# Patient Record
Sex: Male | Born: 1975 | Race: Black or African American | Hispanic: No | Marital: Single | Smoking: Current every day smoker
Health system: Southern US, Community
[De-identification: ages and names within clinical notes are randomized; demographics above are authoritative.]

---

## 1999-01-22 ENCOUNTER — Emergency Department (HOSPITAL_COMMUNITY): Admission: EM | Admit: 1999-01-22 | Discharge: 1999-01-22 | Payer: Self-pay | Admitting: Emergency Medicine

## 2000-04-29 ENCOUNTER — Emergency Department (HOSPITAL_COMMUNITY): Admission: EM | Admit: 2000-04-29 | Discharge: 2000-04-29 | Payer: Self-pay | Admitting: Emergency Medicine

## 2000-06-02 ENCOUNTER — Emergency Department (HOSPITAL_COMMUNITY): Admission: EM | Admit: 2000-06-02 | Discharge: 2000-06-02 | Payer: Self-pay | Admitting: Emergency Medicine

## 2000-07-17 ENCOUNTER — Emergency Department (HOSPITAL_COMMUNITY): Admission: EM | Admit: 2000-07-17 | Discharge: 2000-07-17 | Payer: Self-pay | Admitting: Emergency Medicine

## 2002-07-23 ENCOUNTER — Inpatient Hospital Stay (HOSPITAL_COMMUNITY): Admission: EM | Admit: 2002-07-23 | Discharge: 2002-07-25 | Payer: Self-pay

## 2002-07-23 ENCOUNTER — Encounter: Payer: Self-pay | Admitting: Emergency Medicine

## 2002-07-25 ENCOUNTER — Encounter: Payer: Self-pay | Admitting: Neurosurgery

## 2003-04-17 ENCOUNTER — Emergency Department (HOSPITAL_COMMUNITY): Admission: EM | Admit: 2003-04-17 | Discharge: 2003-04-17 | Payer: Self-pay | Admitting: Emergency Medicine

## 2003-04-19 ENCOUNTER — Emergency Department (HOSPITAL_COMMUNITY): Admission: EM | Admit: 2003-04-19 | Discharge: 2003-04-19 | Payer: Self-pay | Admitting: Emergency Medicine

## 2006-07-07 ENCOUNTER — Emergency Department (HOSPITAL_COMMUNITY): Admission: EM | Admit: 2006-07-07 | Discharge: 2006-07-07 | Payer: Self-pay | Admitting: Emergency Medicine

## 2008-08-08 IMAGING — CR DG CHEST 1V
1 series · 1 of 1 positions shown · non-contrast
Comparison: NONE

CLINICAL DATA: Positive PPD. 

CHEST - SINGLE VIEW (AP)

[view not recorded]
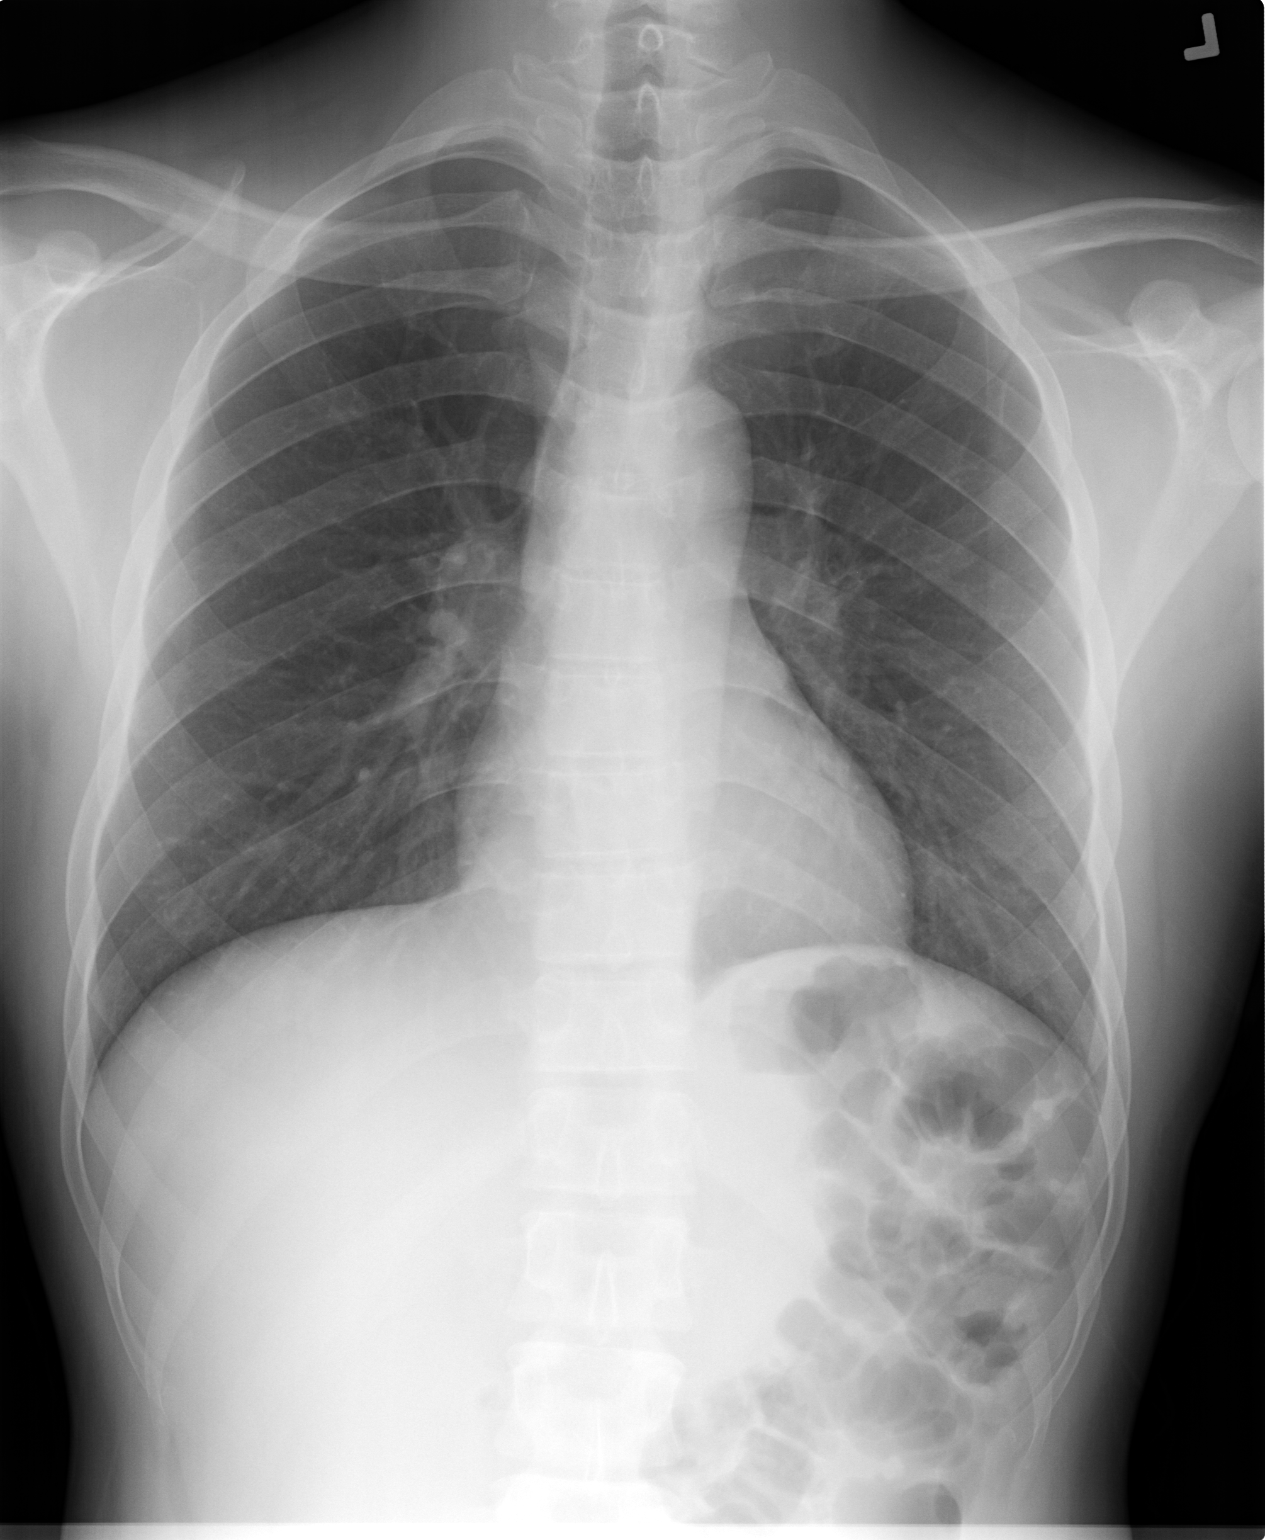

[1 of 1 positions shown; findings below may reference images not displayed]

FINDINGS: There are no prior images for comparison. The lungs are 
clear and well expanded.   Heart and pulmonary vessels are normal. 
 No significant abnormalities are noted in the regional skeleton.
IMPRESSION: No active disease. No active tuberculosis. Rigersi 
10/13/2006  Trans Date: 10/14/2006 [facility]  [REDACTED]

## 2009-08-16 ENCOUNTER — Emergency Department (HOSPITAL_COMMUNITY): Admission: EM | Admit: 2009-08-16 | Discharge: 2009-08-16 | Payer: Self-pay | Admitting: Emergency Medicine

## 2009-11-23 ENCOUNTER — Emergency Department (HOSPITAL_COMMUNITY): Admission: EM | Admit: 2009-11-23 | Discharge: 2009-11-23 | Payer: Self-pay | Admitting: Emergency Medicine

## 2010-05-30 LAB — URINALYSIS, ROUTINE W REFLEX MICROSCOPIC
Bilirubin Urine: NEGATIVE
Ketones, ur: NEGATIVE mg/dL
Nitrite: NEGATIVE
pH: 6 (ref 5.0–8.0)

## 2010-05-30 LAB — GC/CHLAMYDIA PROBE AMP, GENITAL: GC Probe Amp, Genital: NEGATIVE

## 2010-05-30 LAB — URINE MICROSCOPIC-ADD ON

## 2010-06-03 LAB — COMPREHENSIVE METABOLIC PANEL
ALT: 49 U/L (ref 0–53)
CO2: 30 mEq/L (ref 19–32)
Calcium: 9 mg/dL (ref 8.4–10.5)
Creatinine, Ser: 0.64 mg/dL (ref 0.4–1.5)
GFR calc Af Amer: >60 mL/min (ref >60)
GFR calc non Af Amer: >60 mL/min (ref >60)
Glucose, Bld: 92 mg/dL (ref 70–99)
Sodium: 137 mEq/L (ref 135–145)
Total Protein: 7.4 g/dL (ref 6.0–8.3)

## 2010-06-03 LAB — DIFFERENTIAL
Lymphocytes Relative: 30 % (ref 12–46)
Lymphs Abs: 1.6 10*3/uL (ref 0.7–4.0)
Monocytes Relative: 8 % (ref 3–12)
Neutrophils Relative %: 60 % (ref 43–77)

## 2010-06-03 LAB — CBC
Hemoglobin: 14.1 g/dL (ref 13.0–17.0)
MCHC: 34.2 g/dL (ref 30.0–36.0)
MCV: 97.8 fL (ref 78.0–100.0)
RBC: 4.23 MIL/uL (ref 4.22–5.81)
RDW: 13.4 % (ref 11.5–15.5)

## 2010-06-03 LAB — URINALYSIS, ROUTINE W REFLEX MICROSCOPIC
Bilirubin Urine: NEGATIVE
Nitrite: NEGATIVE
Specific Gravity, Urine: 1.009 (ref 1.005–1.030)
Urobilinogen, UA: 0.2 mg/dL (ref 0.0–1.0)
pH: 8 (ref 5.0–8.0)

## 2010-06-03 LAB — SEDIMENTATION RATE: Sed Rate: 1 mm/hr (ref 0–16)

## 2010-06-03 LAB — CK: Total CK: 88 U/L (ref 7–232)

## 2010-06-03 LAB — LIPASE, BLOOD: Lipase: 34 U/L (ref 11–59)

## 2013-01-14 ENCOUNTER — Other Ambulatory Visit: Payer: Self-pay | Admitting: Occupational Medicine

## 2013-01-14 ENCOUNTER — Ambulatory Visit: Payer: Self-pay

## 2013-01-14 DIAGNOSIS — R52 Pain, unspecified: Secondary | ICD-10-CM

## 2015-03-07 ENCOUNTER — Emergency Department (HOSPITAL_COMMUNITY)
Admission: EM | Admit: 2015-03-07 | Discharge: 2015-03-07 | Disposition: A | Discharge status: Home / Self Care | Payer: Self-pay | Attending: Emergency Medicine | Admitting: Emergency Medicine

## 2015-03-07 ENCOUNTER — Encounter (HOSPITAL_COMMUNITY): Payer: Self-pay

## 2015-03-07 DIAGNOSIS — F17200 Nicotine dependence, unspecified, uncomplicated: Secondary | ICD-10-CM | POA: Insufficient documentation

## 2015-03-07 DIAGNOSIS — F1010 Alcohol abuse, uncomplicated: Secondary | ICD-10-CM | POA: Insufficient documentation

## 2015-03-07 LAB — RAPID URINE DRUG SCREEN, HOSP PERFORMED
Amphetamines: NOT DETECTED
BARBITURATES: NOT DETECTED
Benzodiazepines: NOT DETECTED
COCAINE: NOT DETECTED
OPIATES: NOT DETECTED
Tetrahydrocannabinol: NOT DETECTED

## 2015-03-07 LAB — COMPREHENSIVE METABOLIC PANEL
ALK PHOS: 73 U/L (ref 38–126)
ALT: 54 U/L (ref 17–63)
ANION GAP: 9 (ref 5–15)
AST: 75 U/L — ABNORMAL HIGH (ref 15–41)
Albumin: 3.9 g/dL (ref 3.5–5.0)
BILIRUBIN TOTAL: 2.1 mg/dL — AB (ref 0.3–1.2)
BUN: 9 mg/dL (ref 6–20)
CALCIUM: 8.6 mg/dL — AB (ref 8.9–10.3)
CO2: 26 mmol/L (ref 22–32)
Chloride: 106 mmol/L (ref 101–111)
Creatinine, Ser: 0.68 mg/dL (ref 0.61–1.24)
GFR calc non Af Amer: >60 mL/min (ref >60)
Glucose, Bld: 99 mg/dL (ref 65–99)
Potassium: 4.2 mmol/L (ref 3.5–5.1)
SODIUM: 141 mmol/L (ref 135–145)
TOTAL PROTEIN: 7.3 g/dL (ref 6.5–8.1)

## 2015-03-07 LAB — LIPASE, BLOOD: Lipase: 71 U/L — ABNORMAL HIGH (ref 11–51)

## 2015-03-07 LAB — CBC
HCT: 41.6 % (ref 39.0–52.0)
HEMOGLOBIN: 13.8 g/dL (ref 13.0–17.0)
MCH: 32 pg (ref 26.0–34.0)
MCHC: 33.2 g/dL (ref 30.0–36.0)
MCV: 96.5 fL (ref 78.0–100.0)
Platelets: 330 10*3/uL (ref 150–400)
RBC: 4.31 MIL/uL (ref 4.22–5.81)
RDW: 13 % (ref 11.5–15.5)
WBC: 5.1 10*3/uL (ref 4.0–10.5)

## 2015-03-07 LAB — URINALYSIS, ROUTINE W REFLEX MICROSCOPIC
Bilirubin Urine: NEGATIVE
Glucose, UA: NEGATIVE mg/dL
Hgb urine dipstick: NEGATIVE
Ketones, ur: NEGATIVE mg/dL
Leukocytes, UA: NEGATIVE
NITRITE: NEGATIVE
Protein, ur: NEGATIVE mg/dL
SPECIFIC GRAVITY, URINE: 1.006 (ref 1.005–1.030)
pH: 7 (ref 5.0–8.0)

## 2015-03-07 LAB — SALICYLATE LEVEL: Salicylate Lvl: <4 mg/dL (ref 2.8–30.0)

## 2015-03-07 LAB — ACETAMINOPHEN LEVEL

## 2015-03-07 LAB — CK: CK TOTAL: 267 U/L (ref 49–397)

## 2015-03-07 LAB — ETHANOL: Alcohol, Ethyl (B): 363 mg/dL (ref <5)

## 2015-03-07 MED ORDER — LORAZEPAM 2 MG/ML IJ SOLN
2.0000 mg | Freq: Once | INTRAMUSCULAR | Status: DC
  Filled 2015-03-07: qty 1

## 2015-03-07 MED ORDER — SODIUM CHLORIDE 0.9 % IV BOLUS (SEPSIS)
1000.0000 mL | Freq: Once | INTRAVENOUS | Status: AC
  Administered 2015-03-07: 1000 mL via INTRAVENOUS

## 2015-03-07 MED ORDER — HALOPERIDOL LACTATE 5 MG/ML IJ SOLN
5.0000 mg | Freq: Once | INTRAMUSCULAR | Status: DC
  Filled 2015-03-07: qty 1

## 2015-03-07 NOTE — ED Notes (Signed)
Patient verbalized understanding of discharge instructions and denies further questions at this time. VS stable, patient ambulatory with steady gait.

## 2015-03-07 NOTE — ED Notes (Addendum)
Pt called out because he wanted the pulse ox taken off of his finger, stating that it was giving him a headache. This RN took the pulse ox off of his finger.

## 2015-03-07 NOTE — ED Notes (Signed)
Pt found wandering towards the exit. Pt still had his IV in. Pt states that his ride is here, and that they are waiting at the entrance.

## 2015-03-07 NOTE — ED Notes (Signed)
This RN walked into the pt's room to assess the pt, and found Sterling, EMT attempting to have the pt use a urinal. Pt was tearful, and started shouting and chanting loudly. Wickline, MD arrived at pt's room, and pt would not stop crying, shouting and chanting to answer the doctor's questions.

## 2015-03-07 NOTE — ED Notes (Signed)
Patient instructed on use of telephone in room, since his phone has died. Patient calling family members to come pick him up.

## 2015-03-07 NOTE — ED Notes (Signed)
Pt ambulatory to the bathroom with steady gait. Pt given a cup and told to provide a urine sample.

## 2015-03-07 NOTE — ED Provider Notes (Signed)
CSN: [id number]     Arrival date & time 03/07/15  0206 History   By signing my name below, I, Marcus Oliver, attest that this documentation has been prepared under the direction and in the presence of Marcus Rhineonald Brynnley Dayrit, MD.  Electronically Signed: Arlan OrganAshley Oliver, ED Scribe. 03/07/2015. 2:35 AM.   Chief Complaint  Patient presents with  . Leg Pain  . Nausea   The history is provided by the patient. No language interpreter was used.     LEVEL 5 CAVEAT- ALTERED MENTAL STATUS   HPI Comments: Marcus Oliver is a [age] y.o. male without any pertinent past medical history who presents to the Emergency Department complaining of ongoing nausea this evening. Per triage note, pt reports intermittent nausea after smoking his cigarettes. He denies any illicit drug use. Marcus Oliver also reports constant, ongoing soreness to the bilateral lower extremities. Unable to obtain any other details as pt began yelling uncontrollably.     PMH - unknown Soc hx - unknown  Social History  Substance Use Topics  . Smoking status: Current Every Day Smoker  . Smokeless tobacco: None  . Alcohol Use: Yes    Review of Systems  Unable to perform ROS: Mental status change      Allergies  Review of patient's allergies indicates no known allergies.  Home Medications   Prior to Admission medications   Not on File   Triage Vitals: BP 133/89 mmHg  Pulse 123  Temp(Src) 97.8 F (36.6 C) (Oral)  Resp 18  SpO2 97%  Physical Exam   CONSTITUTIONAL: Disheveled, smells of ETOH, agitated HEAD: Normocephalic/atraumatic EYES: EOMI/PERRL ENMT: Mucous membranes moist NECK: supple no meningeal signs SPINE/BACK:entire spine nontender CV: S1/S2 noted, no murmurs/rubs/gallops noted LUNGS: Lungs are clear to auscultation bilaterally ABDOMEN: soft, nontender NEURO: Pt is awake/alert but is agitated and yelling.  He moves all extremities EXTREMITIES:  full ROM, no calf tenderness, no edema, no deformities, distal  pulses equal/intact, the feet are warm to touch, no signs of cellulitis.   SKIN: warm, color normal PSYCH: agitated and yelling   ED Course  Procedures   DIAGNOSTIC STUDIES: Oxygen Saturation is 97% on RA, adequate by my interpretation.    COORDINATION OF CARE: 2:30 AM-Discussed treatment plan with pt at bedside and pt agreed to plan.    pt yelling at staff, he appeared very agitated and unclear cause of his agitation Labs ordered Will monitor patient 4:04 AM Pt more calm He was not given sedation meds as he was de-escalated He is talking on phone He is still intoxicated He has no signs of trauma.  He reports leg pain for "awhile" but can not elaborate anything else 5:44 AM Pt wants to go home He is walking around in no distress Other than ETOH intox, no other acute issues He is stable for d/c home He has ride home  Labs Review Labs Reviewed  LIPASE, BLOOD - Abnormal; Notable for the following:    Lipase 71 (*)    All other components within normal limits  COMPREHENSIVE METABOLIC PANEL - Abnormal; Notable for the following:    Calcium 8.6 (*)    AST 75 (*)    Total Bilirubin 2.1 (*)    All other components within normal limits  URINALYSIS, ROUTINE W REFLEX MICROSCOPIC (NOT [facility]) - Abnormal; Notable for the following:    APPearance CLOUDY (*)    All other components within normal limits  ACETAMINOPHEN LEVEL - Abnormal; Notable for the following:    Acetaminophen (  Tylenol), Serum <10 (*)    All other components within normal limits  ETHANOL - Abnormal; Notable for the following:    Alcohol, Ethyl (B) 363 (*)    All other components within normal limits  CBC  CK  URINE RAPID DRUG SCREEN, HOSP PERFORMED  SALICYLATE LEVEL    I have personally reviewed and evaluated these  lab results as part of my medical decision-making.   EKG Interpretation   Date/Time:  Wednesday March 07 2015 02:42:27 EST Ventricular Rate:  129 PR Interval:  145 QRS Duration: 89 QT  Interval:  319 QTC Calculation: 467 R Axis:   50 Text Interpretation:  Sinus tachycardia Consider left ventricular  hypertrophy No previous ECGs available Confirmed by Bebe Shaggy  MD, Dorinda Hill  [phone]) on 03/07/2015 3:25:35 AM      MDM   Final diagnoses:  Alcohol abuse    Nursing notes including past medical history and social history reviewed and considered in documentation Labs/vital reviewed myself and considered during evaluation    I personally performed the services described in this documentation, which was scribed in my presence. The recorded information has been reviewed and is accurate.       Marcus Rhine, MD 03/07/15 [phone]

## 2015-03-07 NOTE — Discharge Instructions (Signed)
Alcohol Abuse and Nutrition °Alcohol abuse is any pattern of alcohol consumption that harms your health, relationships, or work. Alcohol abuse can affect how your body breaks down and absorbs nutrients from food by causing your liver to work abnormally. Additionally, many people who abuse alcohol do not eat enough carbohydrates, protein, fat, vitamins, and minerals. This can cause poor nutrition (malnutrition) and a lack of nutrients (nutrient deficiencies), which can lead to further complications. °Nutrients that are commonly lacking (deficient) among people who abuse alcohol include: °· Vitamins. °¨ Vitamin A. This is stored in your liver. It is important for your vision, metabolism, and ability to fight off infections (immunity). °¨ B vitamins. These include vitamins such as folate, thiamin, and niacin. These are important in new cell growth and maintenance. °¨ Vitamin C. This plays an important role in iron absorption, wound healing, and immunity. °¨ Vitamin D. This is produced by your liver, but you can also get vitamin D from food. Vitamin D is necessary for your body to absorb and use calcium. °· Minerals. °¨ Calcium. This is important for your bones and your heart and blood vessel (cardiovascular) function. °¨ Iron. This is important for blood, muscle, and nervous system functioning. °¨ Magnesium. This plays an important role in muscle and nerve function, and it helps to control blood sugar and blood pressure. °¨ Zinc. This is important for the normal function of your nervous system and digestive system (gastrointestinal tract). °Nutrition is an essential component of therapy for alcohol abuse. Your health care provider or dietitian will work with you to design a plan that can help restore nutrients to your body and prevent potential complications. °WHAT IS MY PLAN? °Your dietitian may develop a specific diet plan that is based on your condition and any other complications you may have. A diet plan will  commonly include: °· A balanced diet. °¨ Grains: 6-8 oz per day. °¨ Vegetables: 2-3 cups per day. °¨ Fruits: 1-2 cups per day. °¨ Meat and other protein: 5-6 oz per day. °¨ Dairy: 2-3 cups per day. °· Vitamin and mineral supplements. °WHAT DO I NEED TO KNOW ABOUT ALCOHOL AND NUTRITION? °· Consume foods that are high in antioxidants, such as grapes, berries, nuts, green tea, and dark green and orange vegetables. This can help to counteract some of the stress that is placed on your liver by consuming alcohol. °· Avoid food and drinks that are high in fat and sugar. Foods such as sugared soft drinks, salty snack foods, and candy contain empty calories. This means that they lack important nutrients such as protein, fiber, and vitamins. °· Eat frequent meals and snacks. Try to eat 5-6 small meals each day. °· Eat a variety of fresh fruits and vegetables each day. This will help you get plenty of water, fiber, and vitamins in your diet. °· Drink plenty of water and other clear fluids. Try to drink at least 48-64 oz (1.5-2 L) of water per day. °· If you are a vegetarian, eat a variety of protein-rich foods. Pair whole grains with plant-based proteins at meals and snacks to obtain the greatest nutrient benefit from your food. For example, eat rice with beans, put peanut butter on whole-grain toast, or eat oatmeal with sunflower seeds. °· Soak beans and whole grains overnight before cooking. This can help your body to absorb the nutrients more easily. °· Include foods fortified with vitamins and minerals in your diet. Commonly fortified foods include milk, orange juice, cereal, and bread. °· If you   are malnourished, your dietitian may recommend a high-protein, high-calorie diet. This may include: °¨ 2,000-3,000 calories (kilocalories) per day. °¨ 70-100 grams of protein per day. °· Your health care provider may recommend a complete nutritional supplement beverage. This can help to restore calories, protein, and vitamins to  your body. Depending on your condition, you may be advised to consume this instead of or in addition to meals. °· Limit your intake of caffeine. Replace drinks like coffee and black tea with decaffeinated coffee and herbal tea. °· Eat a variety of foods that are high in omega fatty acids. These include fish, nuts and seeds, and soybeans. These foods may help your liver to recover and may also stabilize your mood. °· Certain medicines may cause changes in your appetite, taste, and weight. Work with your health care provider and dietitian to make any adjustments to your medicines and diet plan. °· Include other healthy lifestyle choices in your daily routine. °¨ Be physically active. °¨ Get enough sleep. °¨ Spend time doing activities that you enjoy. °· If you are unable to take in enough food and calories by mouth, your health care provider may recommend a feeding tube. This is a tube that passes through your nose and throat, directly into your stomach. Nutritional supplement beverages can be given to you through the feeding tube to help you get the nutrients you need. °· Take vitamin or mineral supplements as recommended by your health care provider. °WHAT FOODS CAN I EAT? °Grains °Enriched pasta. Enriched rice. Fortified whole-grain bread. Fortified whole-grain cereal. Barley. Brown rice. Quinoa. Millet. °Vegetables °All fresh, frozen, and canned vegetables. Spinach. Kale. Artichoke. Carrots. Winter squash and pumpkin. Sweet potatoes. Broccoli. Cabbage. Cucumbers. Tomatoes. Sweet peppers. Green beans. Peas. Corn. °Fruits °All fresh and frozen fruits. Berries. Grapes. Mango. Papaya. Guava. Cherries. Apples. Bananas. Peaches. Plums. Pineapple. Watermelon. Cantaloupe. Oranges. Avocado. °Meats and Other Protein Sources °Beef liver. Lean beef. Pork. Fresh and canned chicken. Fresh fish. Oysters. Sardines. Canned tuna. Shrimp. Eggs with yolks. Nuts and seeds. Peanut butter. Beans and lentils. Soybeans.  Tofu. °Dairy °Whole, low-fat, and nonfat milk. Whole, low-fat, and nonfat yogurt. Cottage cheese. Sour cream. Hard and soft cheeses. °Beverages °Water. Herbal tea. Decaffeinated coffee. Decaffeinated green tea. 100% fruit juice. 100% vegetable juice. Instant breakfast shakes. °Condiments °Ketchup. Mayonnaise. Mustard. Salad dressing. Barbecue sauce. °Sweets and Desserts °Sugar-free ice cream. Sugar-free pudding. Sugar-free gelatin. °Fats and Oils °Butter. Vegetable oil, flaxseed oil, olive oil, and walnut oil. °Other °Complete nutrition shakes. Protein bars. Sugar-free gum. °The items listed above may not be a complete list of recommended foods or beverages. Contact your dietitian for more options. °WHAT FOODS ARE NOT RECOMMENDED? °Grains °Sugar-sweetened breakfast cereals. Flavored instant oatmeal. Fried breads. °Vegetables °Breaded or deep-fried vegetables. °Fruits °Dried fruit with added sugar. Candied fruit. Canned fruit in syrup. °Meats and Other Protein Sources °Breaded or deep-fried meats. °Dairy °Flavored milks. Fried cheese curds or fried cheese sticks. °Beverages °Alcohol. Sugar-sweetened soft drinks. Sugar-sweetened tea. Caffeinated coffee and tea. °Condiments °Sugar. Honey. Agave nectar. Molasses. °Sweets and Desserts °Chocolate. Cake. Cookies. Candy. °Other °Potato chips. Pretzels. Salted nuts. Candied nuts. °The items listed above may not be a complete list of foods and beverages to avoid. Contact your dietitian for more information. °  °This information is not intended to replace advice given to you by your health care provider. Make sure you discuss any questions you have with your health care provider. °  °Document Released: 12/26/2004 Document Revised: 03/24/2014 Document Reviewed: 10/04/2013 °Elsevier Interactive Patient   Education ©2016 [employer]vier Inc[employer] ° °Emergency Department Resource Guide °1) Find a Doctor and Pay Out of Pocket °Although you won't have to find out who is covered by your insurance  plan, it is a good idea to ask around and get recommendations. You will then need to call the office and see if the doctor you have chosen will accept you as a new patient and what types of options they offer for patients who are self-pay. Some doctors offer discounts or will set up payment plans for their patients who do not have insurance, but you will need to ask so you aren't surprised when you get to your appointment. ° °2) Contact Your Local Health Department °Not all health departments have doctors that can see patients for sick visits, but many do, so it is worth a call to see if yours does. If you don't know where your local health department is, you can check in your phone book. The CDC also has a tool to help you locate your state's health department, and many state websites also have listings of all of their local health departments. ° °3) Find a Walk-in Clinic °If your illness is not likely to be very severe or complicated, you may want to try a walk in clinic. These are popping up all over the country in pharmacies, drugstores, and shopping centers. They're usually staffed by nurse practitioners or physician assistants that have been trained to treat common illnesses and complaints. They're usually fairly quick and inexpensive. However, if you have serious medical issues or chronic medical problems, these are probably not your best option. ° °No Primary Care Doctor: °- Call [facility] at  832-8000 - they can help you locate a primary care doctor that  accepts your insurance, provides certain services, etc. °- Physician Referral Service- 1-800-533-3463 ° °Chronic Pain Problems: °Organization         Address  Phone   Notes  °[facility]  (336) 297-2271 Patients need to be referred by their primary care doctor.  ° °Medication Assistance: °Organization         Address  Phone   Notes  °[place] [facility] [address] °[place], [place] [address]  (336) 641-8030 --Must be a resident of [place] °-- Must have NO insurance coverage whatsoever (no Medicaid/ Medicare, etc.) °-- The pt. MUST have a primary care doctor that directs their care regularly and follows them in the community °  °[facility]  (866) 331-1348   °[employer]  (888) 892-1162   ° °Agencies that provide inexpensive medical care: °Organization         Address  Phone   Notes  °[facility]  (336) 832-8035   °[facility]    (336) 832-7272   °[facility] [address] °[place],  [address] (336) 832-4777   °[facility] [address], °[place] (336) 271-4999   °[facility]    (336) 373-0678   °[facility]    (336) 272-1050   °[facility] ° [address], [place] Phone:  (336) 832-4444, Fax:  (336) 832-4440 Hours of Operation:  9 am - 6 pm, M-F.  Also accepts Medicaid/Medicare and self-pay.  °[facility] ° [address], [place] Phone: (336) 832-3150, Fax: (336) [phone]. Hours of Operation:  8:30 am - 5:30 pm, M-F.  Also accepts Medicaid and   self-pay.  °[facility] [place] [address], [place] Phone: (336) 878-6027   °[facility] [address], [place], [place] (336)723-1848, Ext. 123 Mondays & Thursdays: 7-9 AM.  First 15 patients are seen on a first come, first serve basis. °  ° °Medicaid-accepting [place] Providers: ° °Organization         Address  Phone   Notes  °[facility] [address], [place] (336) 641-2100 Also accepts self-pay patients.  °[facility] [address], [place] ° (336) 856-9996   °[facility] [address], [place] (336) 288-8857   °[facility] [address] [place] [address], [place] (336) 299-7000   °Veita Bland [address], [place]  ° (336)  373-1557 Only accepts [place] Access Medicaid patients after they have their name applied to their card.  ° °Self-Pay (no insurance) in [place]: ° °Organization         Address  Phone   Notes  °Sickle Cell Patients, [facility] [address], [place] (336) 832-1970   °[facility] [address], [place] (336) 832-4400   °[facility] ° [address], [place] (336) 992-4800   °[facility]/Dr. Osei-Bonsu ° [address] [place] [address], [place] or [address], [place] (336) 841-8500 Phone number for both [place] and [place] locations is the same.  °[facility] [address], [place] (336) 299-0000   °[facility] [address] [place] [address], [place] or [address] (336) 852-7530 °(336) 878-2260   °[facility] [address], [place] (336) 350-1642, phone; (336) 294-5005, fax Sees patients 1st and 3rd Saturday of every month.  Must not qualify for public or private insurance (i.e. Medicaid, Medicare, [place] Health Choice, Veterans' Benefits) • Household income should be no more than 200% of the poverty level •The clinic cannot treat you if you are pregnant or think you are pregnant • Sexually transmitted diseases are not treated at the clinic.  ° ° °Dental Care: °Organization         Address  Phone  Notes  °[facility] [address], [place] (336) 641-6152 Accepts children up to age 21 who are enrolled in Medicaid or [place] Health Choice; pregnant women with a Medicaid card; and children who have applied for Medicaid or [place] Health Choice, but were declined, whose parents can pay a reduced fee at time of service.  °[facility] [place]  [address], [place] (336) 641-7733 Accepts children up to age 21 who are enrolled in Medicaid or [place] Health  Choice; pregnant women with a Medicaid card; and children who have applied for Medicaid or [place] Health Choice, but were declined, whose parents can pay a reduced fee at time of service.  °[facility] ° [address],  (336) 641-4533 Patients are seen by appointment only. Walk-ins are not accepted. [facility] will see patients 18 years of age and older. °Monday - Tuesday (8am-5pm) °Most Wednesdays (8:30-5pm) °$30 per visit, cash only  °[facility] ° [address], [place] (336) 641-4533 Patients are seen by appointment only. Walk-ins are not accepted. [facility] will see patients 18   years of age and older. °One Wednesday Evening (Monthly: Volunteer Based).  $30 per visit, cash only  °[facility]  (919) 537-3737 for adults; Children under age 4, call Graduate Pediatric Dentistry at ([phone]) [phone]. Children aged 4-14, please call ([phone]) [phone] to request a pediatric application. ° Dental services are provided in all areas of dental care including fillings, crowns and bridges, complete and partial dentures, implants, gum treatment, root canals, and extractions. Preventive care is also provided. Treatment is provided to both adults and children. °Patients are selected via a lottery and there is often a waiting list. °  °[facility] [address], °[place] ° (336) 763-8833 [email or web address] °  °[facility] [address], [place], [place] (336)723-1848, Ext. 123 Second and Fourth Thursday of each month, opens at 6:30 AM; Clinic ends at 9 AM.  Patients are seen on a first-come first-served basis, and a limited number are seen during each clinic.  ° °[facility] ° [address], [place], [place] (336) 723-7904   Eligibility Requirements °You must have lived in [place], or [place] counties for at least the last three months. °  You cannot be eligible for state or  federal sponsored healthcare insurance, including Veterans Administration, Medicaid, or Medicare. °  You generally cannot be eligible for healthcare insurance through your employer.  °  How to apply: °Eligibility screenings are held every Tuesday and Wednesday afternoon from 1:00 pm until 4:00 pm. You do not need an appointment for the interview!  °[facility] [address], [place], [place] 336-631-2330   °[facility]  336-342-8273   °[facility]  336-703-3100   °[facility]  336-570-6415   ° °Behavioral Health Resources in the Community: °Intensive Outpatient Programs °Organization         Address  Phone  Notes  °[facility] [address], [place], [place] 336-878-6098   °[facility] [address], [place], [place] 336-832-9800   °[facility] [address], [place], [place] ° 336-882-2125   °[facility] [address],  °[place], [place] 1-800-853-5163 or 336-641-4981   °Substance Abuse Resources °Organization         Address  Phone  Notes  °[facility]  336-882-2125   °[facility]  336-784-9470   °[facility]  336-285-9073   °[facility]  336-845-3988   °Residential & Outpatient Substance Abuse Program  1-800-659-3381   °Psychological Services °Organization         Address  Phone  Notes  ° Health  336- 832-9600   °[facility]  336- 378-7881   °[facility] [address], [place] 1-800-853-5163 or 336-641-4981   ° °Mobile Crisis Teams °Organization         Address  Phone  Notes  °[facility], Mobile Crisis Care Unit  1-877-626-1772   °Assertive °[facility] ° [address] [place], [place] 336-834-9664   °Sharon DeEsch [address] °[place] 336-554-5454   ° °Self-Help/Support Groups °Organization         Address  Phone              Notes  °[facility] - variety of support groups  336- 373-1402 Call for more information  °Narcotics Anonymous (NA), [facility] [address], °[place]    2 meetings at this location  ° °Residential Treatment Programs °Organization         Address  Phone  Notes  °[facility] [address],    °[place]  1-866-801-8205   °[facility] ° [address], [place], [place] 704-293-8524   °[facility] [address], [place] 336-845-3988 Admissions: 8am-3pm M-F  °[facility] [address]    °[place], [place] 336-841-1104   °[facility] [address], [place], [place] 336-379-7146   °[facility] [address]  °[place], [place] 336-285-9073   °[facility] Intensive Outpatient [address], [place], [place] 336-852-3033   °[facility] [address]  °[place], [place] 1-877-615-2722 or 336-784-9470   °[facility] [address] [place], [place] 336-227-7417 Accepts Medicaid  °[facility] [address]  °[place] 1-800-659-3381 Substance Abuse/Addiction Treatment  ° °[place] Behavioral Health Resources °Organization         Address  Phone  Notes  °[facility]  (888) 581-9988   °Julie Brannon, PhD [address] [place], [place]   (336) 349-5553 or (336) 951-0000   °[facility]   [address] °[place], [place] (336) 349-4454   °[facility] [address], [place], [place] (336) 342-8316 Insurance/Medicaid/sponsorship through [facility]  °[facility] [address]                                    [place], [place] (336) 342-8316 Therapy/tele-psych/case  °[facility] [address]  ° [place], [place] (336) 349-2233    °Dr. Arfeen  (336) 349-4544   °[facility] [place]  [employer] [place] [facility] 1) [address], [place] °2) [address], [place] °3)  [address], [place] (336) 349-3220 °(336) 342-7768 ° °(336) 342-8140   °[place] [facility] (336) 342-1394 or (336) 342-3537 (After Hours)    ° ° ° °

## 2015-03-07 NOTE — ED Notes (Signed)
Pt states he has been getting nauseated when he smokes his cigarettes. Denies using marijuana or other drugs. Pt states his legs are sore and hurt also

## 2015-11-02 ENCOUNTER — Encounter (HOSPITAL_COMMUNITY): Payer: Self-pay | Admitting: *Deleted

## 2015-11-02 ENCOUNTER — Emergency Department (HOSPITAL_COMMUNITY)
Admission: EM | Admit: 2015-11-02 | Discharge: 2015-11-02 | Disposition: A | Discharge status: Home / Self Care | Payer: Self-pay | Attending: Emergency Medicine | Admitting: Emergency Medicine

## 2015-11-02 DIAGNOSIS — M79605 Pain in left leg: Secondary | ICD-10-CM

## 2015-11-02 DIAGNOSIS — M79604 Pain in right leg: Secondary | ICD-10-CM

## 2015-11-02 DIAGNOSIS — F17210 Nicotine dependence, cigarettes, uncomplicated: Secondary | ICD-10-CM | POA: Insufficient documentation

## 2015-11-02 DIAGNOSIS — T733XXA Exhaustion due to excessive exertion, initial encounter: Secondary | ICD-10-CM | POA: Insufficient documentation

## 2015-11-02 LAB — CBC WITH DIFFERENTIAL/PLATELET
BASOS PCT: 0 %
Basophils Absolute: 0 10*3/uL (ref 0.0–0.1)
EOS ABS: 0.1 10*3/uL (ref 0.0–0.7)
Eosinophils Relative: 1 %
HEMATOCRIT: 39.9 % (ref 39.0–52.0)
HEMOGLOBIN: 13.1 g/dL (ref 13.0–17.0)
Lymphocytes Relative: 30 %
Lymphs Abs: 1.6 10*3/uL (ref 0.7–4.0)
MCH: 32.7 pg (ref 26.0–34.0)
MCHC: 32.8 g/dL (ref 30.0–36.0)
MCV: 99.5 fL (ref 78.0–100.0)
MONOS PCT: 6 %
Monocytes Absolute: 0.3 10*3/uL (ref 0.1–1.0)
NEUTROS ABS: 3.3 10*3/uL (ref 1.7–7.7)
NEUTROS PCT: 63 %
Platelets: 295 10*3/uL (ref 150–400)
RBC: 4.01 MIL/uL — AB (ref 4.22–5.81)
RDW: 13 % (ref 11.5–15.5)
WBC: 5.4 10*3/uL (ref 4.0–10.5)

## 2015-11-02 LAB — COMPREHENSIVE METABOLIC PANEL
ALBUMIN: 3.8 g/dL (ref 3.5–5.0)
ALK PHOS: 54 U/L (ref 38–126)
ALT: 28 U/L (ref 17–63)
ANION GAP: 7 (ref 5–15)
AST: 28 U/L (ref 15–41)
BILIRUBIN TOTAL: 1.9 mg/dL — AB (ref 0.3–1.2)
BUN: 10 mg/dL (ref 6–20)
CALCIUM: 8.6 mg/dL — AB (ref 8.9–10.3)
CO2: 25 mmol/L (ref 22–32)
CREATININE: 0.66 mg/dL (ref 0.61–1.24)
Chloride: 107 mmol/L (ref 101–111)
GFR calc Af Amer: >60 mL/min (ref >60)
GFR calc non Af Amer: >60 mL/min (ref >60)
GLUCOSE: 74 mg/dL (ref 65–99)
Potassium: 4 mmol/L (ref 3.5–5.1)
SODIUM: 139 mmol/L (ref 135–145)
TOTAL PROTEIN: 7.1 g/dL (ref 6.5–8.1)

## 2015-11-02 MED ORDER — NAPROXEN 500 MG PO TABS: 500.0000 mg | ORAL_TABLET | Freq: Two times a day (BID) | ORAL | 0 refills | Status: AC

## 2015-11-02 MED ORDER — SODIUM CHLORIDE 0.9 % IV BOLUS (SEPSIS)
1000.0000 mL | Freq: Once | INTRAVENOUS | Status: AC
  Administered 2015-11-02: 1000 mL via INTRAVENOUS

## 2015-11-02 NOTE — Discharge Instructions (Signed)
Please use number provided in this discharge paper to find a primary care provider for further evaluation and management of your symptoms.  Take naproxen as needed for leg pain.

## 2015-11-02 NOTE — ED Provider Notes (Signed)
[facility] DEPT Provider Note   CSN: [id number] Arrival date & time: 11/02/15  1201     History   Chief Complaint Chief Complaint  Patient presents with  . Leg Pain  . Fatigue    HPI Ludger Marja KaysFormo is a 40 y.o. male.  HPI   40 year old male presents with complaint of leg pain and generalized weakness. Patient states he worked for an [employer] type of job for the past 12 years. He normally have to stand throughout his entire 10 hour shift. For the past 2 years he has had worsening bilateral leg pain after prolonged standing. Symptoms worsen towards the end of his shift and improves with rest. States that he feels more tired in usual and needing more sleep than before. Patient states he was evaluated for the same problem 8 months ago in the ER but no diagnosis was given. He does not have a primary care provider. He denies having fever, chills, headache, pulp ear, chest pain, difficulty breathing, abdominal pain, nausea vomiting diarrhea, focal numbness or weakness, or rash. No history of myasthenia gravis or MS.  No hx of diabetes.  No recent sickness.   History reviewed. No pertinent past medical history.  There are no active problems to display for this patient.   History reviewed. No pertinent surgical history.     Home Medications    Prior to Admission medications   Not on File    Family History No family history on file.  Social History Social History  Substance Use Topics  . Smoking status: Current Every Day Smoker    Packs/day: 0.50    Types: Cigarettes  . Smokeless tobacco: Never Used  . Alcohol use Yes     Comment: week-end drinker     Allergies   Review of patient's allergies indicates no known allergies.   Review of Systems Review of Systems  All other systems reviewed and are negative.    Physical Exam Updated Vital Signs BP 113/73 (BP Location: Right Arm)   Pulse 93   Temp 98 F (36.7 C) (Oral)   Resp 16   Wt 64.4 kg   SpO2 98%    Physical Exam  Constitutional: He is oriented to person, place, and time. He appears well-developed and well-nourished. No distress.  HENT:  Head: Atraumatic.  Eyes: Conjunctivae are normal.  Neck: Normal range of motion. Neck supple.  Cardiovascular: Intact distal pulses.   Musculoskeletal: He exhibits no edema.  5/5 strengths to all 4 extremities  Neurological: He is alert and oriented to person, place, and time.  Neurologic exam:  Speech clear, pupils equal round reactive to light, extraocular movements intact  Normal peripheral visual fields Cranial nerves III through XII normal including no facial droop Follows commands, moves all extremities x4, normal strength to bilateral upper and lower extremities at all major muscle groups including grip Sensation normal to light touch  Coordination intact, no limb ataxia, finger-nose-finger normal Rapid alternating movements normal No pronator drift Gait normal   Skin: No rash noted.  Psychiatric: He has a normal mood and affect.     ED Treatments / Results  Labs (all labs ordered are listed, but only abnormal results are displayed) Labs Reviewed  CBC WITH DIFFERENTIAL/PLATELET - Abnormal; Notable for the following:       Result Value   RBC 4.01 (*)    All other components within normal limits  COMPREHENSIVE METABOLIC PANEL - Abnormal; Notable for the following:    Calcium 8.6 (*)  Total Bilirubin 1.9 (*)    All other components within normal limits    EKG  EKG Interpretation None       Radiology No results found.  Procedures Procedures (including critical care time)  Medications Ordered in ED Medications  sodium chloride 0.9 % bolus 1,000 mL (1,000 mLs Intravenous New Bag/Given 11/02/15 1412)     Initial Impression / Assessment and Plan / ED Course  I have reviewed the triage vital signs and the nursing notes.  Pertinent labs & imaging results that were available during my care of the patient were  reviewed by me and considered in my medical decision making (see chart for details).  Clinical Course    BP 105/78   Pulse 78   Temp 98 F (36.7 C) (Oral)   Resp 14   Wt 64.4 kg   SpO2 100%    Final Clinical Impressions(s) / ED Diagnoses   Final diagnoses:  Fatigue due to excessive exertion, initial encounter  Leg pain, bilateral    New Prescriptions New Prescriptions   NAPROXEN (NAPROSYN) 500 MG TABLET    Take 1 tablet (500 mg total) by mouth 2 (two) times daily.   1:48 PM Patient report having worsening bilateral leg pain after prolonged standing and improves with rest. He works at an [employer] line for the past 12 years.  He has no focal neuro deficits on exam. No focal weakness, no headache or vision changes.  No evidence of infection noted.  Labs are reassuring, no hyperglycemia to suggest diabetes.  Encourage pt to establish primary care management for his chronic condition.    2:48 PM Labs are reassuring, no evidence of anemia and no hyperglycemia concerning for undiagnosed diabetes. Patient will follow-up with primary care file for further evaluation of his condition. Return precaution discussed.   Fayrene HelperBowie Dmitry Macomber, PA-C 11/02/15 1449    Derwood KaplanAnkit Nanavati, MD 11/03/15 1428

## 2015-11-02 NOTE — ED Triage Notes (Addendum)
Pt states BIL LE pain and weakness since Tues.  Pt states these s/s come and go.  Pt also c/o abdominal pain.

## 2016-08-09 ENCOUNTER — Emergency Department (HOSPITAL_COMMUNITY)
Admission: EM | Admit: 2016-08-09 | Discharge: 2016-08-09 | Disposition: A | Discharge status: Home / Self Care | Payer: Self-pay | Attending: Physician Assistant | Admitting: Physician Assistant

## 2016-08-09 ENCOUNTER — Encounter (HOSPITAL_COMMUNITY): Payer: Self-pay | Admitting: *Deleted

## 2016-08-09 DIAGNOSIS — F17210 Nicotine dependence, cigarettes, uncomplicated: Secondary | ICD-10-CM | POA: Insufficient documentation

## 2016-08-09 DIAGNOSIS — K625 Hemorrhage of anus and rectum: Secondary | ICD-10-CM | POA: Insufficient documentation

## 2016-08-09 LAB — CBC WITH DIFFERENTIAL/PLATELET
BASOS ABS: 0 10*3/uL (ref 0.0–0.1)
BASOS PCT: 0 %
EOS ABS: 0.1 10*3/uL (ref 0.0–0.7)
Eosinophils Relative: 1 %
HCT: 39.7 % (ref 39.0–52.0)
HEMOGLOBIN: 12.9 g/dL — AB (ref 13.0–17.0)
LYMPHS ABS: 2 10*3/uL (ref 0.7–4.0)
Lymphocytes Relative: 32 %
MCH: 32.5 pg (ref 26.0–34.0)
MCHC: 32.5 g/dL (ref 30.0–36.0)
MCV: 100 fL (ref 78.0–100.0)
Monocytes Absolute: 0.5 10*3/uL (ref 0.1–1.0)
Monocytes Relative: 7 %
NEUTROS PCT: 60 %
Neutro Abs: 3.6 10*3/uL (ref 1.7–7.7)
Platelets: 294 10*3/uL (ref 150–400)
RBC: 3.97 MIL/uL — AB (ref 4.22–5.81)
RDW: 12.8 % (ref 11.5–15.5)
WBC: 6.1 10*3/uL (ref 4.0–10.5)

## 2016-08-09 LAB — COMPREHENSIVE METABOLIC PANEL
ALT: 34 U/L (ref 17–63)
AST: 31 U/L (ref 15–41)
Albumin: 3.7 g/dL (ref 3.5–5.0)
Alkaline Phosphatase: 56 U/L (ref 38–126)
Anion gap: 10 (ref 5–15)
BUN: 11 mg/dL (ref 6–20)
CHLORIDE: 107 mmol/L (ref 101–111)
CO2: 24 mmol/L (ref 22–32)
CREATININE: 0.66 mg/dL (ref 0.61–1.24)
Calcium: 8.3 mg/dL — ABNORMAL LOW (ref 8.9–10.3)
Glucose, Bld: 75 mg/dL (ref 65–99)
Potassium: 3.9 mmol/L (ref 3.5–5.1)
Sodium: 141 mmol/L (ref 135–145)
Total Bilirubin: 1.7 mg/dL — ABNORMAL HIGH (ref 0.3–1.2)
Total Protein: 6.9 g/dL (ref 6.5–8.1)

## 2016-08-09 LAB — POC OCCULT BLOOD, ED: FECAL OCCULT BLD: NEGATIVE

## 2016-08-09 LAB — ETHANOL: ALCOHOL ETHYL (B): 242 mg/dL — AB (ref <5)

## 2016-08-09 MED ORDER — ADULT MULTIVITAMIN W/MINERALS CH: 1.0000 | ORAL_TABLET | Freq: Once | ORAL | Status: DC

## 2016-08-09 MED ORDER — OMEPRAZOLE 20 MG PO CPDR: 20.0000 mg | DELAYED_RELEASE_CAPSULE | Freq: Every day | ORAL | 0 refills | Status: AC

## 2016-08-09 MED ORDER — THIAMINE HCL 100 MG/ML IJ SOLN
Freq: Once | INTRAVENOUS | Status: AC
  Administered 2016-08-09: 10:00:00 via INTRAVENOUS
  Filled 2016-08-09: qty 1000

## 2016-08-09 MED ORDER — ONE-A-DAY MENS PO TABS
1.0000 | ORAL_TABLET | Freq: Every day | ORAL | 0 refills | Status: AC
Start: 2016-08-09 — End: ?

## 2016-08-09 NOTE — ED Triage Notes (Signed)
Pt reports intermittent rectal bleeding over extended time. Also has pain to bilateral feet, no injury. Ambulatory at triage and no acute distress is noted.

## 2016-08-09 NOTE — Discharge Instructions (Signed)
To find a primary care or specialty doctor please call [phone] or [phone] to access "[place] Find a Doctor Service."  You may also go on the [facility] website at [email or web address]  There are also multiple [facility], [place] and [facility] practices throughout [place] that are frequently accepting new patients. You may find a clinic that is close to your home and contact them.  [facility] -  [address] [place] [phone]  [facility] in [place] (also locations in [place] and [place]) -  [address] [place] [phone]  [facility] -  [address] [place] [address] [phone]    Substance Abuse Treatment Programs  Intensive Outpatient Programs [facility]     [address]      [place], [place]                   [phone]       [facility] [address] [place], [place] [phone]  [facility]     (Inpatient and outpatient)     [address]           [phone]    [facility] [phone] (Suboxone and Methadone)  [address]      [place], [place] [address]      [phone]       [address] [place], [place] [phone]  [facility] (Outpatient/Inpatient, [employer])    (insurance only) [phone]             [facility] (Groups & Residential) [place], [place] [phone]     [facility]     [address]     [place], [place]      [phone]       [facility] (for caregivers and family) [phone] [place], [place] [phone]      Residential Treatment Programs [facility]      [address], [place], [place] [address]  [phone]       [facility] [address] [place], [place] [address] [phone]  [facility]                [phone]       [facility] [phone]  [facility] ([facility]             [address]                                         [place], [place]                                                [phone] or [phone]                               [facility] [place] [address] [place], [phone]  [facility]    [address]      [place], [place]     [phone]       [facility] [address]  [place], [place] [phone]  [facility]   [address] [place], [place] [address]     [phone]      Admissions: 8am-3pm M-F  [facility] (RTS) [address] [place], [place] [phone]  [facility] Program: Residential Program [phone] Days)   [place], [place]      [phone] or [phone]     [facility]: [facility] [place], [place] (Walk in Hours over the weekend or by referral)  [facility] [address] [place], [place], [place] [address] [phone]  Crisis Mobile: [facility]:  [phone] (for crisis response 24 hours a day) [facility] Hotline:      [phone] Outpatient Psychiatry and Counseling  [facility]: Mobile Crisis Management 24 hours:  [phone]  [facility] of the [employer] sliding scale fee and walk in schedule: M-F 8am-12pm/1pm-3pm [address]  [place], [place] [address] [phone]  [facility] [address] [place], [place] [address] [phone]  [facility] (Formerly known as [facility] [employer])- new patient walk-in appointments available Monday - Friday 8am -3pm.          [address] [place], [place] [address] [phone] or crisis line- [phone]  [facility] Services/ Intensive Outpatient Therapy Program [address] [place], [place]  [address] [phone]  [facility]                  Crisis Services      [phone] [address]     [place], [place] [address]                 [facility]   [facility] [phone]. [address] [place], [place] [address]   [employer] of Care          [address]  [place], [place] [address]       [phone]  [facility] [address] [place], [place] [address] [phone]  [facility]    [address]    [place], [place] [address]     [phone]       Andee Poles, MD     [address]     [place] [address]     [phone]       [facility] [address] [place] [address]  [facility]     [address] [place], [place]      [phone]       [facility] Eulogio Ditch, MD [address] [place], [place] [address] [phone]  [facility]     [address]     [place], [place] [address]     [phone]       [facility] [address] [place], [place] [address] [phone] Resources for Temporary Residential Assistance/Crisis Centers  [facility]) M-F 8am-3pm   [address] [place], [place] [address]   [phone] Services include: laundry, barbering, support groups, case management, phone  & computer access, showers, AA/NA mtgs, mental health/substance abuse nurse, job skills class, disability information, VA assistance, spiritual classes, etc.   HOMELESS SHELTERS  [facility]     [facility]   [address], [place]  [phone]              [facility] (women and children)       [address] [place], [place] [address] [phone] [email or web address] for application and process Application Required  [facility]   [address]    [place] [address]     [phone]                    [facility] [address] [place], [place] [address] [phone] application appt.) Application Required  [facility] (women only)    [address]     [place], [place] [address]     [phone]      Intake starts 6pm daily Need valid ID, SSC, & Police report [employer] [address] [place], [place] [phone] Application Required  [employer] (men only)     [address]      [place], [place]     [phone]       [facility] of the [place] (Pregnant women only) [address] [place], [place] [phone]  [facility]      [address]      [place], [place] [address]     [phone]             [facility] [address] [place], [place] [phone] 90 day commitment/SA/Application process  [facility](men only)     [address]     [place], [place]     [phone]       Check-in at [facility] [address] [place], [place] [address] [phone] Men/Women/Women and Children must be there by 7 pm  [facility] [place], [place] [phone]

## 2016-08-09 NOTE — ED Provider Notes (Signed)
[facility] Provider Note   CSN: [id number] Arrival date & time: 08/09/16  [id number]     History   Chief Complaint Chief Complaint  Patient presents with  . Rectal Bleeding  . Foot Pain    HPI Marcus Oliver is a 41 y.o. male.  HPI   Patient's a 41 year old male with several complaints. One complaint is that he has bilateral leg 'fatigue",  he has a difficult time describing it, however I think it sounds as if it's peripheral neuropathy. Patient's been to the emergency department twice for this prior. Patient reports happens occasionally. Patient has no trouble with strength. Patient does report he is a heavy drinker.   Patient reports he also has sometimes has rectal bleeding. He says comes and goes. It only when stooling. We discussed hemorrhoids of patient's unclear what that is.    History reviewed. No pertinent past medical history.  There are no active problems to display for this patient.   History reviewed. No pertinent surgical history.     Home Medications    Prior to Admission medications   Medication Sig Start Date End Date Taking? Authorizing Provider  naproxen (NAPROSYN) 500 MG tablet Take 1 tablet (500 mg total) by mouth 2 (two) times daily. 11/02/15   Fayrene Helperran, [place], PA-C  OVER THE COUNTER MEDICATION Apply 1 application topically as needed (for pain). OTC Pain Cream    [provider]    Family History History reviewed. No pertinent family history.  Social History Social History  Substance Use Topics  . Smoking status: Current Every Day Smoker    Packs/day: 0.50    Types: Cigarettes  . Smokeless tobacco: Never Used  . Alcohol use Yes     Comment: week-end drinker     Allergies   Patient has no known allergies.   Review of Systems Review of Systems  Constitutional: Negative for activity change, fatigue and fever.  Respiratory: Negative for shortness of breath.   Cardiovascular: Negative for chest pain.  Gastrointestinal:  Positive for anal bleeding. Negative for abdominal pain.  Neurological: Positive for tremors and numbness.     Physical Exam Updated Vital Signs Pulse 98   Temp 99 F (37.2 C) (Oral)   Resp 18   Ht 5\' 9"  (1.753 m)   Wt 65.9 kg (145 lb 3 oz)   SpO2 98%   BMI 21.44 kg/m   Physical Exam  Constitutional: He is oriented to person, place, and time. He appears well-nourished.  HENT:  Head: Normocephalic.  Eyes: EOM are normal. Pupils are equal, round, and reactive to light.  Mild icterus  Cardiovascular: Normal rate and regular rhythm.   Pulmonary/Chest: Effort normal and breath sounds normal. No respiratory distress.  Genitourinary: Rectum normal.  Musculoskeletal: Normal range of motion. He exhibits no edema or deformity.  Nl stregnth and sensation in bilateral LE  Neurological: He is oriented to person, place, and time.  Skin: Skin is warm and dry. He is not diaphoretic.  Psychiatric: He has a normal mood and affect. His behavior is normal.     ED Treatments / Results  Labs (all labs ordered are listed, but only abnormal results are displayed) Labs Reviewed  COMPREHENSIVE METABOLIC PANEL  CBC WITH DIFFERENTIAL/PLATELET  OCCULT BLOOD X 1 CARD TO LAB, STOOL  ETHANOL    EKG  EKG Interpretation None       Radiology No results found.  Procedures Procedures (including critical care time)  Medications Ordered in ED Medications  sodium chloride 0.9 %  1,000 mL with thiamine 100 mg, folic acid 1 mg, multivitamins adult 10 mL infusion (not administered)     Initial Impression / Assessment and Plan / ED Course  I have reviewed the triage vital signs and the nursing notes.  Pertinent labs & imaging results that were available during my care of the patient were reviewed by me and considered in my medical decision making (see chart for details).     Patient's a 41 year old male presenting with several complaints. As for his bilateral lower extremity occasional  numbness this could be peripheral neuropathy. We'll encourage patient to use a multivitamin given his drinking. For his rectal bleeding, we'll check his hemoglobin to establish a baselien. Otherwise we'll have patient follow up with a GI physician and started on a proton pump inhibitor. Most important we will give patient information about  primary care physician he can ollow-up with as an outpatient.  Hgb close to baseline, no active bleeding, will have him follow uw with a PCP.   At this time, I do not feel there is any life-threatening condition present. I have reviewed and discussed all results (EKG, imaging, lab, urine as appropriate) and exam findings with patient/family. I have reviewed nursing notes and appropriate previous records.  I feel the patient is safe to be discharged home without further emergent workup and can continue workup as an outpatient as needed. Discussed usual and customary return precautions. Patient/family verbalize understanding and are comfortable with this plan.  Outpatient follow-up has been provided if needed. All questions have been answered.   Final Clinical Impressions(s) / ED Diagnoses   Final diagnoses:  None    New Prescriptions New Prescriptions   No medications on file     Abelino Derrick, MD 08/10/16 1039
